# Patient Record
Sex: Male | Born: 1955 | Race: White | Hispanic: No | Marital: Married | Smoking: Never smoker
Health system: Southern US, Community
[De-identification: ages and names within clinical notes are randomized; demographics above are authoritative.]

## PROBLEM LIST (undated history)

## (undated) HISTORY — PX: CHOLECYSTECTOMY: SHX55

---

## 2010-12-12 ENCOUNTER — Emergency Department (HOSPITAL_COMMUNITY)
Admission: EM | Admit: 2010-12-12 | Discharge: 2010-12-13 | Disposition: A | Discharge status: Other Acute Inpatient Hospital | Payer: Self-pay | Source: Home / Self Care | Admitting: Emergency Medicine

## 2011-02-25 LAB — DIFFERENTIAL
Basophils Absolute: 0 10*3/uL (ref 0.0–0.1)
Lymphs Abs: 1.3 10*3/uL (ref 0.7–4.0)
Monocytes Relative: 13 % — ABNORMAL HIGH (ref 3–12)
Neutrophils Relative %: 72 % (ref 43–77)

## 2011-02-25 LAB — BASIC METABOLIC PANEL
BUN: 7 mg/dL (ref 6–23)
Calcium: 9.4 mg/dL (ref 8.4–10.5)
GFR calc Af Amer: >60 mL/min (ref >60)
Glucose, Bld: 97 mg/dL (ref 70–99)
Potassium: 3.7 mEq/L (ref 3.5–5.1)

## 2011-02-25 LAB — HEPATIC FUNCTION PANEL
AST: 38 U/L — ABNORMAL HIGH (ref 0–37)
Albumin: 4.3 g/dL (ref 3.5–5.2)
Alkaline Phosphatase: 58 U/L (ref 39–117)
Indirect Bilirubin: 0.4 mg/dL (ref 0.3–0.9)
Total Bilirubin: 0.5 mg/dL (ref 0.3–1.2)

## 2011-02-25 LAB — CBC
HCT: 39.7 % (ref 39.0–52.0)
Hemoglobin: 14.4 g/dL (ref 13.0–17.0)
MCV: 89.4 fL (ref 78.0–100.0)
RBC: 4.44 MIL/uL (ref 4.22–5.81)
RDW: 12.4 % (ref 11.5–15.5)

## 2011-02-25 LAB — URINALYSIS, ROUTINE W REFLEX MICROSCOPIC
Bilirubin Urine: NEGATIVE
Glucose, UA: NEGATIVE mg/dL
Ketones, ur: NEGATIVE mg/dL
Protein, ur: NEGATIVE mg/dL
Urobilinogen, UA: 0.2 mg/dL (ref 0.0–1.0)
pH: 6 (ref 5.0–8.0)

## 2011-02-25 LAB — RAPID URINE DRUG SCREEN, HOSP PERFORMED
Amphetamines: NOT DETECTED
Benzodiazepines: POSITIVE — AB

## 2011-03-08 ENCOUNTER — Other Ambulatory Visit (HOSPITAL_COMMUNITY): Payer: Self-pay | Admitting: Family Medicine

## 2011-03-08 ENCOUNTER — Ambulatory Visit (HOSPITAL_COMMUNITY)
Admission: RE | Admit: 2011-03-08 | Discharge: 2011-03-08 | Disposition: A | Discharge status: Home / Self Care | Payer: BC Managed Care – PPO | Source: Ambulatory Visit | Attending: Family Medicine | Admitting: Family Medicine

## 2011-03-08 DIAGNOSIS — K8020 Calculus of gallbladder without cholecystitis without obstruction: Secondary | ICD-10-CM | POA: Insufficient documentation

## 2011-03-08 DIAGNOSIS — R109 Unspecified abdominal pain: Secondary | ICD-10-CM | POA: Insufficient documentation

## 2011-03-08 DIAGNOSIS — R933 Abnormal findings on diagnostic imaging of other parts of digestive tract: Secondary | ICD-10-CM | POA: Insufficient documentation

## 2011-03-08 DIAGNOSIS — K7689 Other specified diseases of liver: Secondary | ICD-10-CM | POA: Insufficient documentation

## 2011-03-08 MED ORDER — IOHEXOL 300 MG/ML  SOLN
100.0000 mL | Freq: Once | INTRAMUSCULAR | Status: AC | PRN
  Administered 2011-03-08: 100 mL via INTRAVENOUS

## 2011-04-08 ENCOUNTER — Other Ambulatory Visit (HOSPITAL_COMMUNITY): Payer: Self-pay | Admitting: General Surgery

## 2011-04-08 DIAGNOSIS — K8020 Calculus of gallbladder without cholecystitis without obstruction: Secondary | ICD-10-CM

## 2011-04-11 ENCOUNTER — Ambulatory Visit (HOSPITAL_COMMUNITY)
Admission: RE | Admit: 2011-04-11 | Discharge: 2011-04-11 | Disposition: A | Discharge status: Home / Self Care | Payer: BC Managed Care – PPO | Source: Ambulatory Visit | Attending: General Surgery | Admitting: General Surgery

## 2011-04-11 ENCOUNTER — Encounter (HOSPITAL_COMMUNITY): Payer: BC Managed Care – PPO

## 2011-04-11 DIAGNOSIS — K8020 Calculus of gallbladder without cholecystitis without obstruction: Secondary | ICD-10-CM

## 2011-04-11 DIAGNOSIS — K7689 Other specified diseases of liver: Secondary | ICD-10-CM | POA: Insufficient documentation

## 2011-04-29 ENCOUNTER — Ambulatory Visit (INDEPENDENT_AMBULATORY_CARE_PROVIDER_SITE_OTHER): Payer: BC Managed Care – PPO | Admitting: Internal Medicine

## 2011-04-29 DIAGNOSIS — R933 Abnormal findings on diagnostic imaging of other parts of digestive tract: Secondary | ICD-10-CM

## 2011-04-30 ENCOUNTER — Encounter (HOSPITAL_COMMUNITY): Payer: BC Managed Care – PPO

## 2011-04-30 ENCOUNTER — Other Ambulatory Visit: Payer: Self-pay | Admitting: Infectious Diseases

## 2011-05-03 ENCOUNTER — Observation Stay (HOSPITAL_COMMUNITY)
Admission: RE | Admit: 2011-05-03 | Discharge: 2011-05-04 | Disposition: A | Discharge status: Home / Self Care | Payer: BC Managed Care – PPO | Source: Ambulatory Visit | Attending: General Surgery | Admitting: General Surgery

## 2011-05-03 ENCOUNTER — Other Ambulatory Visit: Payer: Self-pay | Admitting: General Surgery

## 2011-05-03 DIAGNOSIS — K8010 Calculus of gallbladder with chronic cholecystitis without obstruction: Principal | ICD-10-CM | POA: Insufficient documentation

## 2011-05-03 DIAGNOSIS — Z79899 Other long term (current) drug therapy: Secondary | ICD-10-CM | POA: Insufficient documentation

## 2011-05-03 DIAGNOSIS — Z01812 Encounter for preprocedural laboratory examination: Secondary | ICD-10-CM | POA: Insufficient documentation

## 2011-05-03 DIAGNOSIS — E119 Type 2 diabetes mellitus without complications: Secondary | ICD-10-CM | POA: Insufficient documentation

## 2011-05-03 DIAGNOSIS — R1011 Right upper quadrant pain: Secondary | ICD-10-CM | POA: Insufficient documentation

## 2011-05-03 DIAGNOSIS — R112 Nausea with vomiting, unspecified: Secondary | ICD-10-CM | POA: Insufficient documentation

## 2011-05-03 LAB — GLUCOSE, CAPILLARY
Glucose-Capillary: 123 mg/dL — ABNORMAL HIGH (ref 70–99)
Glucose-Capillary: 100 mg/dL — ABNORMAL HIGH (ref 70–99)

## 2011-05-04 LAB — DIFFERENTIAL
Basophils Relative: 0 % (ref 0–1)
Eosinophils Absolute: 0 10*3/uL (ref 0.0–0.7)
Lymphocytes Relative: 18 % (ref 12–46)
Monocytes Relative: 9 % (ref 3–12)
Neutro Abs: 6.9 10*3/uL (ref 1.7–7.7)

## 2011-05-04 LAB — BASIC METABOLIC PANEL
Calcium: 9.1 mg/dL (ref 8.4–10.5)
Creatinine, Ser: 0.86 mg/dL (ref 0.4–1.5)
GFR calc non Af Amer: >60 mL/min (ref >60)
Sodium: 137 mEq/L (ref 135–145)

## 2011-05-04 LAB — HEPATIC FUNCTION PANEL
ALT: 78 U/L — ABNORMAL HIGH (ref 0–53)
AST: 53 U/L — ABNORMAL HIGH (ref 0–37)
Alkaline Phosphatase: 68 U/L (ref 39–117)
Bilirubin, Direct: 0.1 mg/dL (ref 0.0–0.3)

## 2011-05-04 LAB — CBC
Hemoglobin: 12.7 g/dL — ABNORMAL LOW (ref 13.0–17.0)
Platelets: 147 10*3/uL — ABNORMAL LOW (ref 150–400)

## 2011-05-08 NOTE — Op Note (Signed)
NAMEJOSEDEJESUS, George [name]                ACCOUNT NO.:  [id number]  MEDICAL RECORD NO.:  [id number]           PATIENT TYPE:  O  LOCATION:  A308                          FACILITY:  [facility]  PHYSICIAN:  Barbaraann Barthel, M.D. DATE OF BIRTH:  [birth year]  DATE OF PROCEDURE:  05/03/2011 DATE OF DISCHARGE:                              OPERATIVE REPORT   SURGEON:  Barbaraann Barthel, MD  PREOPERATIVE DIAGNOSIS:  Cholecystitis, cholelithiasis  POSTOPERATIVE DIAGNOSIS:  Cholecystitis, cholelithiasis  PROCEDURE:  Laparoscopic cholecystectomy.  SPECIMEN:  Gallbladder and stones.  WOUND CLASSIFICATION:  Clean contaminated (minimal bile spillage).  INDICATIONS:  This is a 55 year old white male who had recurrent episodes of right upper quadrant pain, nausea, and vomiting which was postprandial in nature.  We had planned to do a laparoscopic cholecystectomy on him and which we discussed in detail with him.  He had cancelled once or twice before, however, I convinced him that since he was symptomatic that he would likely have recurrent problems and he finally consented to surgery.  We discussed complications not limited to but including bleeding, infection, damage to bile ducts, perforation of organs, transitory diarrhea and the possibility that open cholecystectomy might have to be performed.  Informed consent was obtained.  GROSS OPERATIVE FINDINGS:  The patient had some fatty infiltration of the distal portion of the gallbladder.  Cystic duct was of normal caliber.  Cholangiogram was not performed.  The right upper quadrant otherwise was within normal limits. There was a stone in the gallblader.  TECHNIQUE:  The patient was placed in supine position.  After adequate administration of general anesthesia via endotracheal intubation, his entire wound was prepped with Betadine solution and draped in the usual manner.  A Foley catheter was aseptically inserted.  An incision was carried out over the  superior aspect of the umbilicus down to the fascia which was grasped with a sharp towel clip and elevated.  A Veress needle was then inserted and confirmed the position with a saline drop test. We then insufflated the abdomen with approximately 3.5 L of CO2.  Then using the Visiport technique, an 11-mm cannula was placed in the umbilicus incision and then under direct vision an 11-mm cannula was placed in the epigastrium and two 5-mm cannulas in the right upper quadrant laterally.  The gallbladder was then grasped.  The adhesions were taken down.  The cystic duct and cystic artery were clearly visualized, triply silver clipped, and divided.  The gallbladder was removed from the liver bed using the hook cautery device with minimal problems.  There was little oozing from the gallbladder as the patient had a thin-walled gallbladder that was adherent to the liver and we had little biliary spillage.  This was not a problem.  This was diluted with normal saline solution and suctioned out.  The gallbladder was then removed from the abdomen using the EndoCatch device and then after checking for hemostasis and irrigating, I then elected to leave a piece of Surgicel in the liver bed and a Jackson-Pratt drain which exited from one of the lateral most incisions.  The abdomen was then desufflated and the  port sites were infiltrated with 0.5% Sensorcaine for postoperative comfort and the fascia was approximated in the area of the umbilicus and the epigastrium with 0-Polysorb.  The skin was approximated with stapling device.  The drain was sutured in place with 3-0 nylon.  Prior to closure, all sponge, needle, and instrument counts were found to be correct.  Estimated blood loss was minimal.  The patient received about 1600 mL of crystalloids intraoperatively.  There were no complications.     Barbaraann Barthel, M.D.     WB/MEDQ  D:  05/03/2011  T:  05/04/2011  Job:  [id number]  cc:   Angus G.  Renard Matter, MD Fax: [phone]  Electronically Signed by Barbaraann Barthel M.D. on 05/08/2011 03:47:23 PM

## 2011-05-08 NOTE — Discharge Summary (Signed)
[name], [name]                ACCOUNT NO.:  [id number]  MEDICAL RECORD NO.:  [id number]           PATIENT TYPE:  O  LOCATION:  A308                          FACILITY:  [facility]  PHYSICIAN:  Barbaraann Barthel, M.D. DATE OF BIRTH:  1956/03/05  DATE OF ADMISSION:  05/03/2011 DATE OF DISCHARGE:  05/19/2012LH                              DISCHARGE SUMMARY   DIAGNOSES: 1. Cholecystitis. 2. Cholelithiasis.  PROCEDURE:  On May 03, 2011, laparoscopic cholecystectomy.  SECONDARY DIAGNOSES: 1. Diabetes mellitus type 2. 2. History of gastroesophageal reflux disease.  Note, this is a 55 year old white male who had recurrent episodes of right upper quadrant postprandial pain radiating to his back, who was seen in the emergency room as well as as an outpatient and was referred for surgical evaluation.  We discussed the need for cholecystectomy with him, discussing complications and he was admitted via the outpatient department for the surgery.  This was undertaken on May 03, 2011, uneventfully and he was observed postoperatively and discharged on the first postoperative day.  At the time of discharge, his vital signs were stable.  He was afebrile.  His blood sugars were well controlled and his liver function studies showed a normal bilirubin with mild a expected postoperative elevation of his AST and ALT to 53 and 78 units respectively.  His white count was 9.5 and his H and H was 12.7 and 37.6.  His hospital course was completely uneventful.  He was discharged on the first postoperative day, at which time, he was tolerating p.o. well.  He was afebrile.  His wounds were clean.  He had minimal serosanguineous JP drainage.  This was removed on his first postoperative day when his wounds were redressed.  He had no shortness of breath or leg pain or dysuria.  He was discharged and told to continue his home medications which will include Claritin 10 mg 2-3 times a day, metformin 1000 mg  b.i.d., methocarbamol t.i.d., Nasonex 2 sprays nasally as needed, omeprazole 1 tablet daily as needed, quinine 324 mg as needed, and Ventolin inhaler as needed.  He is to follow up with Dr. Renard Matter who will follow up with him from a medical point of view and I will follow him perioperatively. At the time of discharge, he was doing quite well and we have made arrangements to follow up with him on Wednesday, May 08, 2011, at 2 o'clock p.m.  He is to call us or go to the emergency room should there be any acute changes.  He is discharged and excused from work.  He is told to do no lifting greater than 5 pounds, no driving, no sexual activities, permitted to walk indoors and outdoors and up and down the stairs and he is permitted to take a shower.  He is discharged on a full liquid and soft diet.  He is told to clean his wounds with alcohol 3 times a day.  We will follow up with him perioperatively, after which time, he is to return to Dr. Renard Matter for any medical problems he may have in the future.     Barbaraann Barthel, M.D.  WB/MEDQ  D:  05/04/2011  T:  05/05/2011  Job:  [id number]  cc:   Angus G. Renard Matter, MD Fax: [phone]  Electronically Signed by Barbaraann Barthel M.D. on 05/08/2011 03:52:18 PM

## 2011-05-19 NOTE — Consult Note (Addendum)
George [name], George [name]                ACCOUNT NO.:  [id number]  MEDICAL RECORD NO.:  [id number]           PATIENT TYPE:  O  LOCATION:  DAY                           FACILITY:  [facility]  PHYSICIAN:  Lionel December, M.D.    DATE OF BIRTH:  Jun 12, 1956  DATE OF CONSULTATION:  04/29/2011 DATE OF DISCHARGE:                                CONSULTATION   REASON FOR CONSULTATION:  Rectal wall thickening on abdominopelvic CT.  HISTORY OF PRESENT ILLNESS:  George [name] is a 55 year old Caucasian male who is referred through courtesy of Dr. Renard Matter for GI evaluation.  He was evaluated in March for abdominal pain.  He had upper abdominal pain.  He was seen in the emergency room and had abdominopelvic CT which showed calcified gallstone, mild diffuse fatty liver, single tiny pancreatic calcification felt to be nonspecific without evidence of chronic pancreatitis.  He did not have any evidence of dilated bile duct or dilated pancreatic duct.  His LFTs, amylase, and lipase have been normal.  He was also found to have diabetes and begun on therapy by Dr. Renard Matter.  He has seen Dr. Malvin Johns and scheduled to have laparoscopic cholecystectomy later this week.  He states he has had 3 episodes of upper abdominal pain; however, he is presently pain-free.  He states he has a good appetite; however, he is trying to lose weight so he can have better control in his glucose levels and he is hoping to come off metformin.  He says his bowels generally move regularly, occasionally he may be constipated and take of stool softener.  In the past, he used to take ex-lax but stopped using it at recommendation of friend.  He has intermittent heartburn for which he uses Prilosec OTC with good control. He denies nausea, vomiting, or dysphagia.  He also denies melena or rectal bleeding.  He has never been screened for colorectal carcinoma.  CURRENT MEDICATIONS: 1. Omeprazole 20 mg daily p.r.n. 2. Ibuprofen 200 mg daily p.r.n. 3.  Metformin 500 mg p.o. b.i.d. 4. Quinine 324 mg daily p.r.n. cramps. 5. Claritin OTC 1 daily. 6. Nasonex one squirt to each nostril daily.  PAST MEDICAL HISTORY:  He recently had gum surgery about 2-3 months ago with tooth implants.  He has problems with seasonal allergy.  He has had symptoms with GERD for less than 2 years, on intermittent PPI therapy. He was diagnosed with diabetes mellitus about 6 weeks ago.  ALLERGIES:  NK.  FAMILY HISTORY:  Mother was diabetic and lived to be 26.  Father had a CVA and died at age 66.  He has 2 sisters, one is in good health and other one has coronary artery disease.  He has a brother, aged 81 in good health and he lost another brother at age 74 of auto accident.  SOCIAL HISTORY:  He has been married for 20 years.  He has 7 children in good health.  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  He does not smoke cigarettes but chews tobacco which he has done for over 35 years.  He had  few drinks when he was age 10, but none thereafter.  LABORATORY DATA:  From March 08, 2011; WBC 5.9, H and H 14.6 and 42.0, and platelet count 205,000.  Amylase 31 and lipase 57.  His glucose level was 263.  I do not have his LFTs.  Abdominopelvic CT reviewed.  He has calcified gallstones.  There is no calcification in the bile duct access to suggest stones.  He does have distal rectal wall thickening but this area is not fully distended.  ASSESSMENT:  George [name] is a 55 year old Caucasian male who underwent abdominopelvic CT because of upper abdominal pain revealing cholelithiasis accounting for his pain as well as rectal wall thickening.  I suspect this rectal wall thickening may be secondary to poor rectal distention but I agree a polyp or neoplasm needs to be ruled out.  He does not have any hematochezia or change in his bowel habits.  The patient is scheduled for laparoscopic cholecystectomy later this week.  RECOMMENDATIONS:  The  patient can continue with his antireflux measures and high-fiber diet.  He will undergo diagnostic colonoscopy after few weeks once he is recovered from his cholecystectomy.  We appreciate the opportunity to participate in the care of this gentleman.          ______________________________ Lionel December, M.D.     NR/MEDQ  D:  04/29/2011  T:  04/30/2011  Job:  [id number]  cc:   Angus G. Renard Matter, MD Fax: [phone]  Electronically Signed by Lionel December M.D. on 05/19/2011 12:17:19 PM

## 2015-07-14 ENCOUNTER — Other Ambulatory Visit (HOSPITAL_COMMUNITY): Payer: Self-pay | Admitting: Family Medicine

## 2015-07-14 ENCOUNTER — Ambulatory Visit (HOSPITAL_COMMUNITY)
Admission: RE | Admit: 2015-07-14 | Discharge: 2015-07-14 | Disposition: A | Discharge status: Home / Self Care | Payer: BLUE CROSS/BLUE SHIELD | Source: Ambulatory Visit | Attending: Family Medicine | Admitting: Family Medicine

## 2015-07-14 DIAGNOSIS — R059 Cough, unspecified: Secondary | ICD-10-CM

## 2015-07-14 DIAGNOSIS — R05 Cough: Secondary | ICD-10-CM | POA: Insufficient documentation

## 2015-07-14 DIAGNOSIS — R0602 Shortness of breath: Secondary | ICD-10-CM | POA: Diagnosis not present

## 2017-07-28 ENCOUNTER — Encounter (HOSPITAL_COMMUNITY): Payer: Self-pay | Admitting: *Deleted

## 2017-07-28 DIAGNOSIS — R0602 Shortness of breath: Secondary | ICD-10-CM | POA: Insufficient documentation

## 2017-07-28 DIAGNOSIS — I10 Essential (primary) hypertension: Secondary | ICD-10-CM | POA: Diagnosis not present

## 2017-07-28 NOTE — ED Triage Notes (Signed)
Pt c/o increased tightness to chest with sob for the last 3 months; pt states he needs an albuterol inhaler

## 2017-07-29 ENCOUNTER — Emergency Department (HOSPITAL_COMMUNITY)
Admission: EM | Admit: 2017-07-29 | Discharge: 2017-07-29 | Disposition: A | Discharge status: Home / Self Care | Payer: BLUE CROSS/BLUE SHIELD | Attending: Emergency Medicine | Admitting: Emergency Medicine

## 2017-07-29 ENCOUNTER — Emergency Department (HOSPITAL_COMMUNITY): Payer: BLUE CROSS/BLUE SHIELD

## 2017-07-29 DIAGNOSIS — R0602 Shortness of breath: Secondary | ICD-10-CM

## 2017-07-29 LAB — CBC WITH DIFFERENTIAL/PLATELET
BASOS ABS: 0 10*3/uL (ref 0.0–0.1)
Basophils Relative: 0 %
EOS PCT: 3 %
Eosinophils Absolute: 0.3 10*3/uL (ref 0.0–0.7)
HCT: 45.2 % (ref 39.0–52.0)
Hemoglobin: 16.1 g/dL (ref 13.0–17.0)
LYMPHS ABS: 3.3 10*3/uL (ref 0.7–4.0)
LYMPHS PCT: 33 %
MCH: 32 pg (ref 26.0–34.0)
MCHC: 35.6 g/dL (ref 30.0–36.0)
MCV: 89.9 fL (ref 78.0–100.0)
MONO ABS: 1.2 10*3/uL — AB (ref 0.1–1.0)
Monocytes Relative: 12 %
Neutro Abs: 5 10*3/uL (ref 1.7–7.7)
Neutrophils Relative %: 52 %
PLATELETS: 189 10*3/uL (ref 150–400)
RBC: 5.03 MIL/uL (ref 4.22–5.81)
RDW: 12.4 % (ref 11.5–15.5)
WBC: 9.8 10*3/uL (ref 4.0–10.5)

## 2017-07-29 LAB — BASIC METABOLIC PANEL
Anion gap: 9 (ref 5–15)
BUN: 11 mg/dL (ref 6–20)
CALCIUM: 9.9 mg/dL (ref 8.9–10.3)
CO2: 28 mmol/L (ref 22–32)
Chloride: 100 mmol/L — ABNORMAL LOW (ref 101–111)
Creatinine, Ser: 1.04 mg/dL (ref 0.61–1.24)
GFR calc Af Amer: >60 mL/min (ref >60)
GLUCOSE: 185 mg/dL — AB (ref 65–99)
Potassium: 4.4 mmol/L (ref 3.5–5.1)
Sodium: 137 mmol/L (ref 135–145)

## 2017-07-29 LAB — TROPONIN I: Troponin I: <0.03 ng/mL (ref <0.03)

## 2017-07-29 MED ORDER — ALBUTEROL SULFATE HFA 108 (90 BASE) MCG/ACT IN AERS: 2.0000 | INHALATION_SPRAY | RESPIRATORY_TRACT | 0 refills | Status: AC | PRN

## 2017-07-29 MED ORDER — ALBUTEROL SULFATE HFA 108 (90 BASE) MCG/ACT IN AERS
2.0000 | INHALATION_SPRAY | Freq: Once | RESPIRATORY_TRACT | Status: AC
  Administered 2017-07-29: 2 via RESPIRATORY_TRACT
  Filled 2017-07-29: qty 6.7

## 2017-07-29 NOTE — ED Provider Notes (Signed)
[facility]-EMER[facility]ENCY DEPT Provider Note   CSN: [id number] Arrival date & time: 07/28/17  2333     History   Chief Complaint Chief Complaint  Patient presents with  . Shortness of Breath    HPI AUDLEY HINOJOS is a 61 y.o. male.  HPI  This is a 61 year old male with no reported past medical history who presents with shortness of breath. Patient reports a 3 month history of worsening shortness of breath and occasional chest tightness. Patient states it has worsened over the last 2 days. He is previously used an inhaler with relief but has been out of an inhaler. Denies any history of smoking or COPD. Denies any recent coughs or fevers. Currently he denies any chest tightness. Denies any lower extremity swelling. Denies history of hypertension, hyperlipidemia, diabetes. He is notably hypertensive at triage.  History reviewed. No pertinent past medical history.  There are no active problems to display for this patient.   Past Surgical History:  Procedure Laterality Date  . CHOLECYSTECTOMY         Home Medications    Prior to Admission medications   Medication Sig Start Date End Date Taking? Authorizing Provider  albuterol (PROVENTIL HFA;VENTOLIN HFA) 108 (90 Base) MCG/ACT inhaler Inhale 2 puffs into the lungs every 4 (four) hours as needed for wheezing or shortness of breath. 07/29/17   Horton, Mayer Masker, MD    Family History History reviewed. No pertinent family history.  Social History Social History  Substance Use Topics  . Smoking status: Never Smoker  . Smokeless tobacco: Current User    Types: Chew  . Alcohol use No     Allergies   Patient has no known allergies.   Review of Systems Review of Systems  Constitutional: Negative for fever.  Respiratory: Positive for chest tightness and shortness of breath.   Cardiovascular: Negative for chest pain and leg swelling.  Gastrointestinal: Negative for abdominal pain, nausea and vomiting.  Genitourinary: Negative  for dysuria.  Musculoskeletal: Negative for back pain.  All other systems reviewed and are negative.    Physical Exam Updated Vital Signs BP (!) 154/102 (BP Location: Left Arm)   Pulse 84   Temp 98.4 F (36.9 C) (Oral)   Resp 19   Ht 5\' 8"  (1.727 m)   Wt 86.2 kg (190 lb)   SpO2 96%   BMI 28.89 kg/m   Physical Exam  Constitutional: He is oriented to person, place, and time. He appears well-developed and well-nourished. No distress.  HENT:  Head: Normocephalic and atraumatic.  Cardiovascular: Normal rate, regular rhythm and normal heart sounds.   No murmur heard. Pulmonary/Chest: Effort normal and breath sounds normal. No respiratory distress. He has no wheezes. He exhibits no tenderness.  Abdominal: Soft. Bowel sounds are normal. There is no tenderness. There is no rebound.  Musculoskeletal: He exhibits no edema.  Neurological: He is alert and oriented to person, place, and time.  Skin: Skin is warm and dry.  Psychiatric: He has a normal mood and affect.  Nursing note and vitals reviewed.    ED Treatments / Results  Labs (all labs ordered are listed, but only abnormal results are displayed) Labs Reviewed  CBC WITH DIFFERENTIAL/PLATELET - Abnormal; Notable for the following:       Result Value   Monocytes Absolute 1.2 (*)    All other components within normal limits  BASIC METABOLIC PANEL - Abnormal; Notable for the following:    Chloride 100 (*)    Glucose, Bld 185 (*)  All other components within normal limits  TROPONIN I    EKG  EKG Interpretation  Date/Time:  Tuesday July 29 2017 00:02:04 EDT Ventricular Rate:  78 PR Interval:  178 QRS Duration: 98 QT Interval:  370 QTC Calculation: 421 R Axis:   104 Text Interpretation:  Sinus rhythm with occasional Premature ventricular complexes Rightward axis Borderline ECG No prior for comparison Confirmed by Ross MarcusHorton, Courtney ([id number]) on 07/29/2017 1:18:20 AM       Radiology Dg Chest 2 View  Result Date:  07/29/2017 CLINICAL DATA:  Chronic shortness of breath and generalized chest tightness. Initial encounter. EXAM: CHEST  2 VIEW COMPARISON:  Chest radiograph performed 07/14/2015 FINDINGS: The lungs are well-aerated and clear. There is no evidence of focal opacification, pleural effusion or pneumothorax. The heart is normal in size; the mediastinal contour is within normal limits. No acute osseous abnormalities are seen. IMPRESSION: No acute cardiopulmonary process seen. Electronically Signed   By: Roanna RaiderJeffery  Chang M.D.   On: 07/29/2017 00:51    Procedures Procedures (including critical care time)  Medications Ordered in ED Medications  albuterol (PROVENTIL HFA;VENTOLIN HFA) 108 (90 Base) MCG/ACT inhaler 2 puff (2 puffs Inhalation Given 07/29/17 0111)     Initial Impression / Assessment and Plan / ED Course  I have reviewed the triage vital signs and the nursing notes.  Pertinent labs & imaging results that were available during my care of the patient were reviewed by me and considered in my medical decision making (see chart for details).     Patient presents with acute on chronic shortness of breath. Previously has improved with an albuterol inhaler. He is nontoxic-appearing. Hypertensive. No acute chest pain. Only risk factor for ACS is hypertension. Heart score is 2. Troponin is negative and EKG is largely reassuring. Doubt ACS at this time. However, he does need follow-up with cardiology. While he does not have any wheezing on exam, he reports improvement with an albuterol inhaler. For this reason, albuterol was ordered. Patient stated that he feels much better. X-rays negative for pneumonia. Will provide with an inhaler. Given chronicity of symptoms, focal 1 troponin at this time is adequate. Follow-up recommended with cardiology and primary physician.  After history, exam, and medical workup I feel the patient has been appropriately medically screened and is safe for discharge home. Pertinent  diagnoses were discussed with the patient. Patient was given return precautions.   Final Clinical Impressions(s) / ED Diagnoses   Final diagnoses:  SOB (shortness of breath)    New Prescriptions New Prescriptions   ALBUTEROL (PROVENTIL HFA;VENTOLIN HFA) 108 (90 BASE) MCG/ACT INHALER    Inhale 2 puffs into the lungs every 4 (four) hours as needed for wheezing or shortness of breath.     Shon BatonHorton, Courtney F, MD 07/29/17 [phone]

## 2017-07-29 NOTE — Discharge Instructions (Signed)
You were seen today for shortness of breath. Your workup is reassuring. There is no pneumonia on chest x-ray. You had no significant wheezing but did seem to improve some with albuterol. Take as needed. Follow-up with your primary physician.

## 2019-04-23 IMAGING — DX DG CHEST 2V
2 series · 2 of 2 positions shown · non-contrast
Comparison: Chest radiograph performed 07/14/2015

CLINICAL DATA: Chronic shortness of breath and generalized chest
tightness. Initial encounter.

EXAM:
CHEST  2 VIEW

[chest pa]
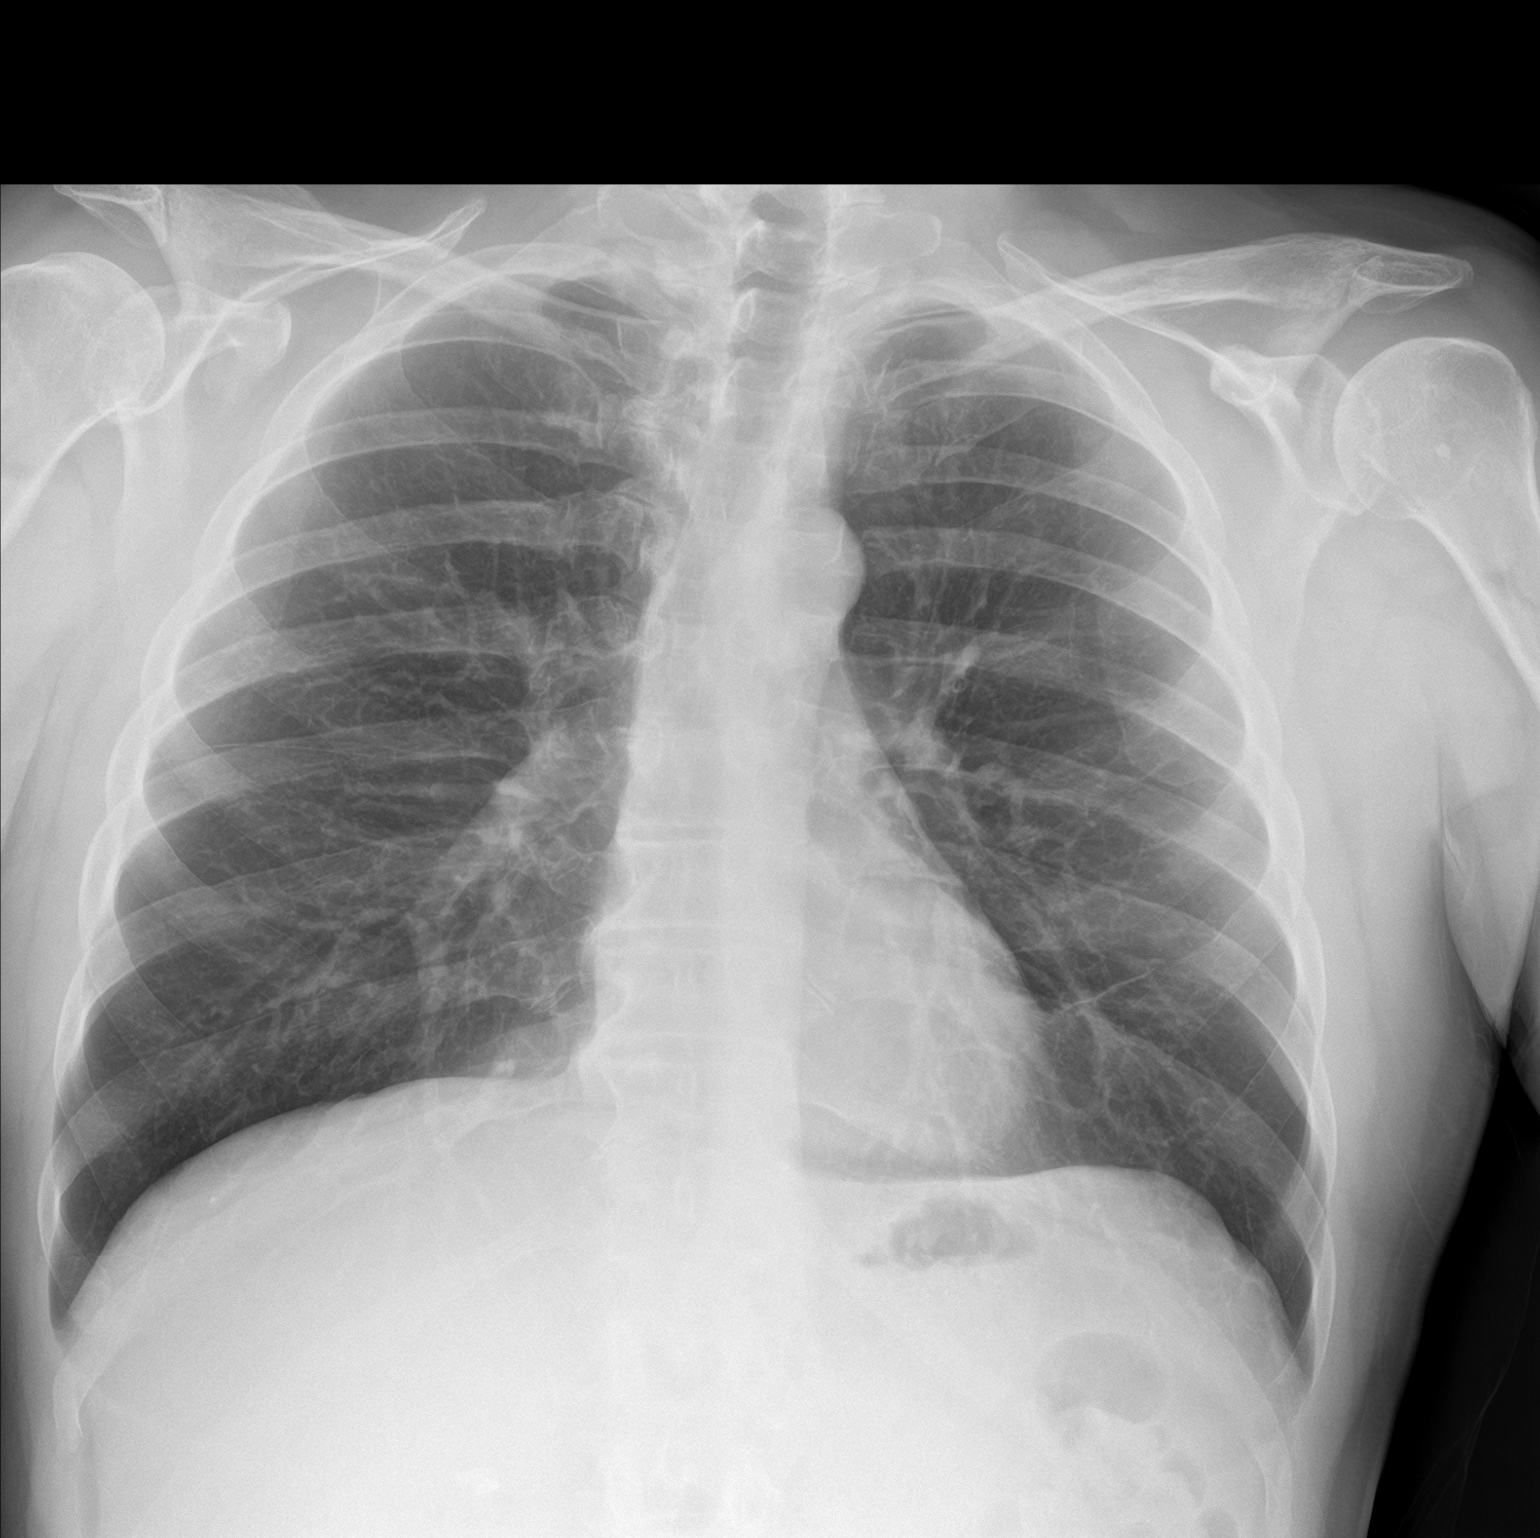

[chest lat]
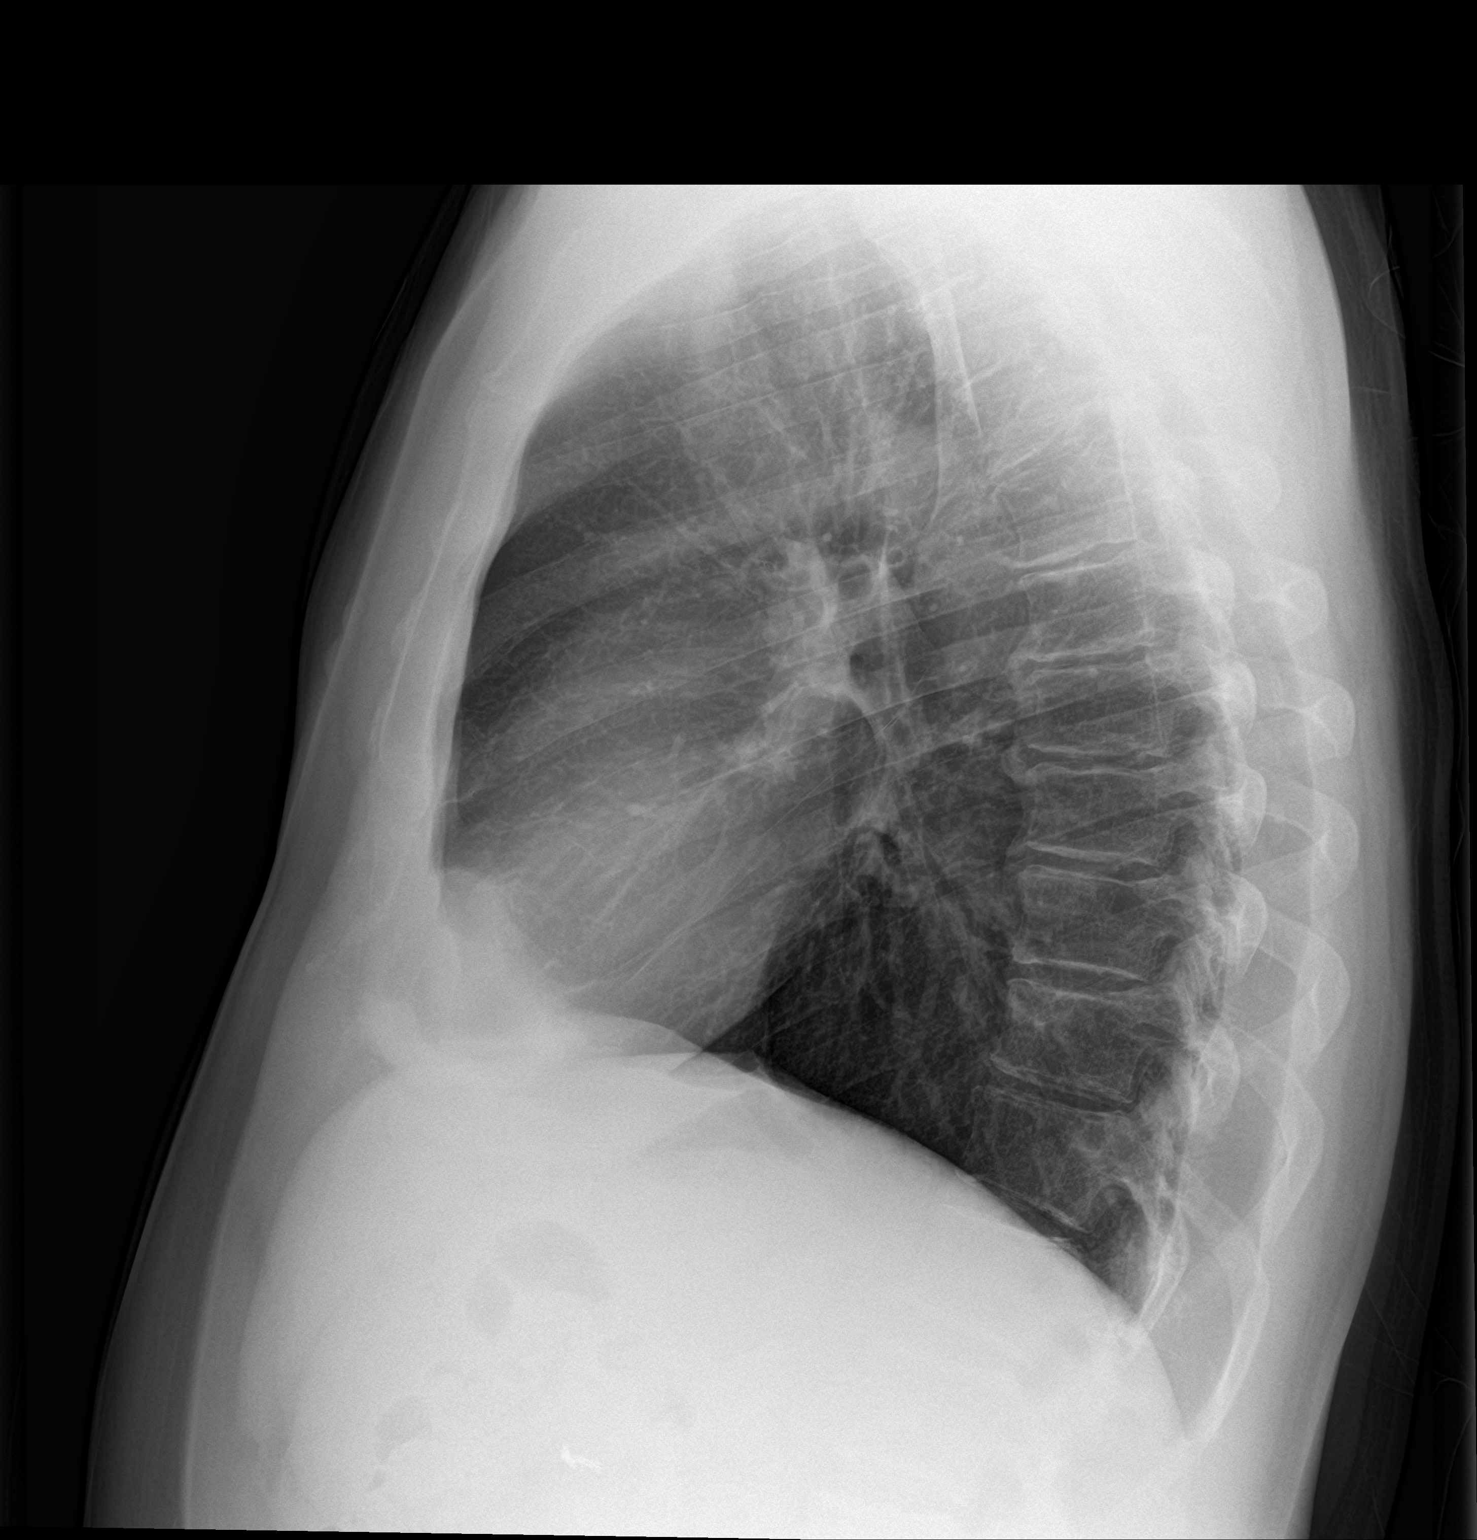

[2 of 2 positions shown; findings below may reference images not displayed]

FINDINGS: The lungs are well-aerated and clear. There is no evidence of focal
opacification, pleural effusion or pneumothorax.

The heart is normal in size; the mediastinal contour is within
normal limits. No acute osseous abnormalities are seen.
IMPRESSION: No acute cardiopulmonary process seen.

## 2023-02-04 DIAGNOSIS — E1165 Type 2 diabetes mellitus with hyperglycemia: Secondary | ICD-10-CM | POA: Diagnosis not present

## 2023-02-04 DIAGNOSIS — Z6833 Body mass index (BMI) 33.0-33.9, adult: Secondary | ICD-10-CM | POA: Diagnosis not present

## 2023-02-04 DIAGNOSIS — E559 Vitamin D deficiency, unspecified: Secondary | ICD-10-CM | POA: Diagnosis not present

## 2023-02-04 DIAGNOSIS — Z0000 Encounter for general adult medical examination without abnormal findings: Secondary | ICD-10-CM | POA: Diagnosis not present

## 2023-02-04 DIAGNOSIS — R0789 Other chest pain: Secondary | ICD-10-CM | POA: Diagnosis not present

## 2023-02-04 DIAGNOSIS — R5383 Other fatigue: Secondary | ICD-10-CM | POA: Diagnosis not present

## 2023-02-04 DIAGNOSIS — R030 Elevated blood-pressure reading, without diagnosis of hypertension: Secondary | ICD-10-CM | POA: Diagnosis not present

## 2023-02-04 DIAGNOSIS — Z125 Encounter for screening for malignant neoplasm of prostate: Secondary | ICD-10-CM | POA: Diagnosis not present

## 2023-02-17 DIAGNOSIS — E1165 Type 2 diabetes mellitus with hyperglycemia: Secondary | ICD-10-CM | POA: Diagnosis not present

## 2023-02-17 DIAGNOSIS — Z6834 Body mass index (BMI) 34.0-34.9, adult: Secondary | ICD-10-CM | POA: Diagnosis not present

## 2023-02-17 DIAGNOSIS — R0789 Other chest pain: Secondary | ICD-10-CM | POA: Diagnosis not present

## 2023-02-17 DIAGNOSIS — E559 Vitamin D deficiency, unspecified: Secondary | ICD-10-CM | POA: Diagnosis not present

## 2023-02-17 DIAGNOSIS — R030 Elevated blood-pressure reading, without diagnosis of hypertension: Secondary | ICD-10-CM | POA: Diagnosis not present

## 2023-02-17 DIAGNOSIS — E039 Hypothyroidism, unspecified: Secondary | ICD-10-CM | POA: Diagnosis not present

## 2023-03-08 DIAGNOSIS — R110 Nausea: Secondary | ICD-10-CM | POA: Diagnosis not present

## 2023-03-08 DIAGNOSIS — Z1211 Encounter for screening for malignant neoplasm of colon: Secondary | ICD-10-CM | POA: Diagnosis not present

## 2023-03-08 DIAGNOSIS — R0781 Pleurodynia: Secondary | ICD-10-CM | POA: Diagnosis not present

## 2023-03-08 DIAGNOSIS — R079 Chest pain, unspecified: Secondary | ICD-10-CM | POA: Diagnosis not present

## 2023-05-19 DIAGNOSIS — X32XXXA Exposure to sunlight, initial encounter: Secondary | ICD-10-CM | POA: Diagnosis not present

## 2023-05-19 DIAGNOSIS — L218 Other seborrheic dermatitis: Secondary | ICD-10-CM | POA: Diagnosis not present

## 2023-05-19 DIAGNOSIS — L400 Psoriasis vulgaris: Secondary | ICD-10-CM | POA: Diagnosis not present

## 2023-05-19 DIAGNOSIS — L570 Actinic keratosis: Secondary | ICD-10-CM | POA: Diagnosis not present

## 2023-07-29 ENCOUNTER — Encounter: Payer: Self-pay | Admitting: Pharmacist

## 2023-07-29 NOTE — Progress Notes (Signed)
Pharmacy Quality Measure Review  This patient is appearing on a report for being at risk of failing the adherence measure for diabetes medications this calendar year.   Medication: metformin ER 750mg  (written by Cletis Media, [facility] with [facility] on [address]) Last fill date: 04/04/2023 for 30 day supply  Per [facility] insurance report patient is listed as having Dr Meredith Staggers as his PCP but I could not find that patient has ever seen Dr Neva Seat.  It looks like most recent prescriptions have been written by Dr Nita Sells (dermatologist) and Mylinda Latina, [facility]   Tried to call patient but unable to reach - asked that he check with his insurance plan to see who is listed as PCP and to update based on who he is currently seeing. IF he has no PCP he can call back to make appointment as new patient.
